# Patient Record
Sex: Female | Born: 1970 | Race: Black or African American | Hispanic: No | Marital: Single | Smoking: Never smoker
Health system: Southern US, Community
[De-identification: ages and names within clinical notes are randomized; demographics above are authoritative.]

---

## 2019-04-09 ENCOUNTER — Encounter (HOSPITAL_COMMUNITY): Payer: Self-pay | Admitting: Emergency Medicine

## 2019-04-09 ENCOUNTER — Emergency Department (HOSPITAL_COMMUNITY): Payer: Self-pay

## 2019-04-09 ENCOUNTER — Other Ambulatory Visit: Payer: Self-pay

## 2019-04-09 ENCOUNTER — Emergency Department (HOSPITAL_COMMUNITY)
Admission: EM | Admit: 2019-04-09 | Discharge: 2019-04-09 | Disposition: A | Discharge status: Home / Self Care | Payer: Self-pay | Attending: Emergency Medicine | Admitting: Emergency Medicine

## 2019-04-09 DIAGNOSIS — Z8616 Personal history of COVID-19: Secondary | ICD-10-CM

## 2019-04-09 DIAGNOSIS — R0789 Other chest pain: Secondary | ICD-10-CM | POA: Insufficient documentation

## 2019-04-09 DIAGNOSIS — R9431 Abnormal electrocardiogram [ECG] [EKG]: Secondary | ICD-10-CM

## 2019-04-09 DIAGNOSIS — Z8619 Personal history of other infectious and parasitic diseases: Secondary | ICD-10-CM

## 2019-04-09 LAB — COMPREHENSIVE METABOLIC PANEL
ALT: 14 U/L (ref 0–44)
AST: 20 U/L (ref 15–41)
Albumin: 3.3 g/dL — ABNORMAL LOW (ref 3.5–5.0)
Alkaline Phosphatase: 48 U/L (ref 38–126)
Anion gap: 7 (ref 5–15)
BUN: 13 mg/dL (ref 6–20)
CO2: 26 mmol/L (ref 22–32)
Calcium: 9.1 mg/dL (ref 8.9–10.3)
Chloride: 106 mmol/L (ref 98–111)
Creatinine, Ser: 1.06 mg/dL — ABNORMAL HIGH (ref 0.44–1.00)
GFR calc Af Amer: >60 mL/min (ref >60)
GFR calc non Af Amer: >60 mL/min (ref >60)
Glucose, Bld: 102 mg/dL — ABNORMAL HIGH (ref 70–99)
Potassium: 3.8 mmol/L (ref 3.5–5.1)
Sodium: 139 mmol/L (ref 135–145)
Total Bilirubin: 0.5 mg/dL (ref 0.3–1.2)
Total Protein: 6.7 g/dL (ref 6.5–8.1)

## 2019-04-09 LAB — CBC
HCT: 39.7 % (ref 36.0–46.0)
Hemoglobin: 12.8 g/dL (ref 12.0–15.0)
MCH: 29 pg (ref 26.0–34.0)
MCHC: 32.2 g/dL (ref 30.0–36.0)
MCV: 90 fL (ref 80.0–100.0)
Platelets: 186 10*3/uL (ref 150–400)
RBC: 4.41 MIL/uL (ref 3.87–5.11)
RDW: 13.2 % (ref 11.5–15.5)
WBC: 6.4 10*3/uL (ref 4.0–10.5)
nRBC: 0 % (ref 0.0–0.2)

## 2019-04-09 LAB — TROPONIN I (HIGH SENSITIVITY): Troponin I (High Sensitivity): 4 ng/L (ref <18)

## 2019-04-09 MED ORDER — BENZONATATE 100 MG PO CAPS
200.0000 mg | ORAL_CAPSULE | Freq: Once | ORAL | Status: AC
  Administered 2019-04-09: 200 mg via ORAL
  Filled 2019-04-09: qty 2

## 2019-04-09 MED ORDER — ALBUTEROL SULFATE HFA 108 (90 BASE) MCG/ACT IN AERS
4.0000 | INHALATION_SPRAY | Freq: Once | RESPIRATORY_TRACT | Status: AC
  Administered 2019-04-09: 4 via RESPIRATORY_TRACT
  Filled 2019-04-09: qty 6.7

## 2019-04-09 MED ORDER — AEROCHAMBER PLUS FLO-VU SMALL MISC
1.0000 | Freq: Once | Status: AC
  Administered 2019-04-09: 1
  Filled 2019-04-09: qty 1

## 2019-04-09 MED ORDER — PROMETHAZINE-DM 6.25-15 MG/5ML PO SYRP: 5.0000 mL | ORAL_SOLUTION | Freq: Four times a day (QID) | ORAL | 0 refills | Status: DC | PRN

## 2019-04-09 NOTE — ED Notes (Signed)
Patient verbalizes understanding of discharge instructions. Opportunity for questioning and answers were provided. Armband removed by staff, pt discharged from ED.  

## 2019-04-09 NOTE — ED Provider Notes (Signed)
[facility] Provider Note   CSN: [id number] Arrival date & time: 04/09/19  1737    History   Chief Complaint Chief Complaint  Patient presents with  . Shortness of Breath    HPI Janet Perry is a 48 y.o. female with no pertinent chronic past medical history who presents to the emergency department with a chief complaint of chest pressure.  The patient reports that she began having symptoms at COVID-19 on June 20 and had a positive test on June 22 after she was tested in a school gymnasium.  She reports that she works at [employer] [place] and several of her coworkers have tested positive.  She reports that she was initially having nausea, subjective fevers, and chills, but feels as if the symptoms have significantly subsided since onset.  She is here for evaluation in the ER today for pressure in the middle of her chest that is been intermittent for the last 2 days.  She reports the episodes will last for approximately 5 minutes before resolving.  She does not have any factors that bring on the pressure or relieve it.  She does not know any aggravating or alleviating factors.  She reports that she has had 2 episodes of pressure since she has been roomed in the ER.  She reports that she has been having some exertional shortness of breath, but denies shortness of breath at this time.  She reports that shortness of breath is only exacerbated by exertion and resolves with rest.  Shortness of breath does not accompany chest pressure.   She does report that she is continued to have a nonproductive cough.  She has been taking Alka-Seltzer cold and sinus with some improvement.  She denies palpitations, back pain, leg pain or swelling, orthopnea, vomiting, abdominal pain.   She reports that she is has self quarantined for 2 weeks. She is supposed to return to work in 2 days.  She is requesting a work note for another week off of work.  No  family history of VTE.  The patient does not take OCPs.  No recent surgery or immobilization.  No family history of cardiovascular disease.  The patient does not have a history of PAD, hypertension, or diabetes mellitus.  She is a non-smoker.   Janet Perry was evaluated in Emergency Department on 04/09/2019 for the symptoms described in the history of present illness. She was evaluated in the context of the global COVID-19 pandemic, which necessitated consideration that the patient might be at risk for infection with the SARS-CoV-2 virus that causes COVID-19. Institutional protocols and algorithms that pertain to the evaluation of patients at risk for COVID-19 are in a state of rapid change based on information released by regulatory bodies including the CDC and federal and state organizations. These policies and algorithms were followed during the patient's care in the ED.     The history is provided by the patient. No language interpreter was used.    History reviewed. No pertinent past medical history.  There are no active problems to display for this patient.   History reviewed. No pertinent surgical history.   OB History   No obstetric history on file.      Home Medications    Prior to Admission medications   Medication Sig Start Date End Date Taking? Authorizing Provider  promethazine-dextromethorphan (PROMETHAZINE-DM) 6.25-15 MG/5ML syrup Take 5 mLs by mouth 4 (four) times daily as needed for cough. 04/09/19  Shia Eber A, PA-C    Family History No family history on file.  Social History Social History   Tobacco Use  . Smoking status: Never Smoker  . Smokeless tobacco: Never Used  Substance Use Topics  . Alcohol use: Yes  . Drug use: Never     Allergies   Patient has no known allergies.   Review of Systems Review of Systems  Constitutional: Negative for activity change, chills and fever.  HENT: Negative for congestion and sore throat.   Eyes: Negative for  visual disturbance.  Respiratory: Positive for cough. Negative for shortness of breath and wheezing.   Cardiovascular: Positive for chest pain. Negative for palpitations and leg swelling.  Gastrointestinal: Negative for abdominal pain, diarrhea and vomiting.  Genitourinary: Negative for dysuria.  Musculoskeletal: Negative for back pain.  Skin: Negative for rash.  Allergic/Immunologic: Negative for immunocompromised state.  Neurological: Negative for weakness, numbness and headaches.  Psychiatric/Behavioral: Negative for confusion.     Physical Exam Updated Vital Signs BP 130/81   Pulse 66   Temp 98.5 F (36.9 C) (Oral)   Resp 16   SpO2 97%   Physical Exam Vitals signs and nursing note reviewed.  Constitutional:      General: She is not in acute distress.    Appearance: She is not ill-appearing, toxic-appearing or diaphoretic.     Comments: Well-appearing.  No acute distress.  HENT:     Head: Normocephalic.  Eyes:     Conjunctiva/sclera: Conjunctivae normal.     Pupils: Pupils are equal, round, and reactive to light.  Neck:     Musculoskeletal: Neck supple.  Cardiovascular:     Rate and Rhythm: Normal rate and regular rhythm.     Heart sounds: No murmur. No friction rub. No gallop.   Pulmonary:     Effort: Pulmonary effort is normal. No respiratory distress.     Breath sounds: No stridor. No wheezing, rhonchi or rales.     Comments: Lungs are clear to auscultation bilaterally.  No increased work of breathing.  Patient has had multiple nonproductive coughing episodes during evaluation. Chest:     Chest wall: No tenderness.  Abdominal:     General: There is no distension.     Palpations: Abdomen is soft. There is no mass.     Tenderness: There is no abdominal tenderness. There is no right CVA tenderness, left CVA tenderness, guarding or rebound.     Hernia: No hernia is present.  Musculoskeletal:        General: No tenderness.     Right lower leg: No edema.     Left  lower leg: No edema.  Skin:    General: Skin is warm.     Findings: No rash.  Neurological:     Mental Status: She is alert.  Psychiatric:        Behavior: Behavior normal.      ED Treatments / Results  Labs (all labs ordered are listed, but only abnormal results are displayed) Labs Reviewed  TROPONIN I (HIGH SENSITIVITY)  TROPONIN I (HIGH SENSITIVITY)  CBC  COMPREHENSIVE METABOLIC PANEL    EKG None  Radiology Dg Chest Portable 1 View  Result Date: 04/09/2019 CLINICAL DATA:  Positive COVID-19 test.  Weakness and cough. EXAM: PORTABLE CHEST 1 VIEW COMPARISON:  None. FINDINGS: The study is limited due to the portable technique. The cardiomediastinal silhouette is within normal limits given technique. Mild atelectasis in the left base. No other acute abnormalities. IMPRESSION: No active disease.  Electronically Signed   By: Gerome Samavid  Williams III M.D   On: 04/09/2019 18:23    Procedures Procedures (including critical care time)  Medications Ordered in ED Medications  albuterol (VENTOLIN HFA) 108 (90 Base) MCG/ACT inhaler 4 puff (has no administration in time range)  AeroChamber Plus Flo-Vu Small device MISC 1 each (has no administration in time range)  benzonatate (TESSALON) capsule 200 mg (has no administration in time range)     Initial Impression / Assessment and Plan / ED Course  I have reviewed the triage vital signs and the nursing notes.  Pertinent labs & imaging results that were available during my care of the patient were reviewed by me and considered in my medical decision making (see chart for details).        48 year old female with 14 days of COVID symptoms.  She tested +12 days ago on June 22 [facility].  She has no chronic medical conditions.  She is here today with intermittent chest pressure that began yesterday.  She has been having exertional shortness of breath that resolves with rest.  Chest pressure and shortness of breath  are not correlated.  She is PERC negative.  She is a low risk heart score given her medical history.  Although she has COVID-19, I have a low suspicion for PE.  Vital signs are normal.  Chest x-ray is unremarkable.  Will order basic labs, EKG, and one troponin.  If labs are reassuring, will plan for discharge to home with supportive care.  Patient is requesting a work note for an additional week off of work.  Discussed that since she has been afebrile and has already quarantined for 14 days, then she can return to work based on [employer] guidelines.  [facility] care transferred to [facility] at the end of my shift. Patient presentation, ED course, and plan of care discussed with review of all pertinent labs and imaging. Please see his/her note for further details regarding further ED course and disposition.   Final Clinical Impressions(s) / ED Diagnoses   Final diagnoses:  History of 2019 novel coronavirus disease (COVID-19)  Sensation of chest pressure    ED Discharge Orders         Ordered    promethazine-dextromethorphan (PROMETHAZINE-DM) 6.25-15 MG/5ML syrup  4 times daily PRN     04/09/19 1906           Parv Manthey, Coral ElseMia A, PA-C 04/09/19 1922    Gallianouratolo, Adam, DO 04/09/19 2335

## 2019-04-09 NOTE — ED Triage Notes (Signed)
Pt c/o shortness of breath, chest pain and chills x 2 weeks. Pt tested positive for COVID on 6/25. Denies worsening symptoms, but no improvement of symptoms.

## 2019-04-09 NOTE — ED Provider Notes (Signed)
I assumed care of patient from previous team at shift change, please see their note for full H&P.  Briefly patient is here for evaluation of intermittent chest pressure.  She was recently diagnosed with coronavirus however that was 2 weeks ago.  She reports overall she is doing better.   Plan is to follow-up on chest x-ray, EKG, labs, and obtain 1 troponin.  Labs Reviewed  COMPREHENSIVE METABOLIC PANEL - Abnormal; Notable for the following components:      Result Value   Glucose, Bld 102 (*)    Creatinine, Ser 1.06 (*)    Albumin 3.3 (*)    All other components within normal limits  TROPONIN I (HIGH SENSITIVITY)  CBC  TROPONIN I (HIGH SENSITIVITY)    Dg Chest Portable 1 View  Result Date: 04/09/2019 CLINICAL DATA:  Positive COVID-19 test.  Weakness and cough. EXAM: PORTABLE CHEST 1 VIEW COMPARISON:  None. FINDINGS: The study is limited due to the portable technique. The cardiomediastinal silhouette is within normal limits given technique. Mild atelectasis in the left base. No other acute abnormalities. IMPRESSION: No active disease. Electronically Signed   By: Dorise Bullion III M.D   On: 04/09/2019 18:23      EKG Interpretation  Date/Time:  Saturday April 09 2019 19:06:06 EDT Ventricular Rate:  64 PR Interval:    QRS Duration: 84 QT Interval:  511 QTC Calculation: 528 R Axis:   52 Text Interpretation:  Sinus rhythm Borderline T abnormalities, diffuse leads Prolonged QT interval No previous ECGs available Confirmed by Gareth Morgan [phone]) on 04/09/2019 9:50:30 PM        Plan established by previous team.  Labs were reviewed without evidence of acute abnormality.  Chest x-ray does not show significant consolidation or other acute abnormality.  EKG without evidence of ischemia, does show mildly prolonged QT interval.  Do not suspect that QT interval is contributing to patient's symptoms today.  She was given written information about prolonged QT syndrome including some of the  medications to avoid.  Return precautions were discussed with patient who states their understanding.  At the time of discharge patient denied any unaddressed complaints or concerns.  Patient is agreeable for discharge home.     Lorin Glass, Hershal Coria 04/09/19 2203    Gareth Morgan, MD 04/15/19 [phone]

## 2019-04-09 NOTE — Discharge Instructions (Addendum)
Thank you for allowing me to care for you today in the Emergency Department.   Use the thermometer at home to monitor your fever.  If your temperature is less than 100.4 you can return to work.  If your fever returns, you can take 650 mg of Tylenol once every 6 hours.  Use 2 puffs of the albuterol inhaler with a spacer every 4 hours as needed for shortness of breath or cough.  You can swallow 5 mL's of Promethazine DM every 6 hours as needed for cough.  Return to the emergency department if your lips or fingers turn blue, if you develop persistent vomiting, persistent shortness of breath, if you pass out, or develop other new, concerning symptoms.  Your EKG showed that you have something called prolonged QT interval.  Please avoid medication such as Benadryl, nausea medicines including Zofran.  If you take these it can worsen this condition.  If it becomes much much longer you can have abnormal heart rhythms.  Please follow-up with your primary care doctor.

## 2020-04-30 IMAGING — DX PORTABLE CHEST - 1 VIEW
1 series · 1 of 1 positions shown · non-contrast
Comparison: None.

CLINICAL DATA: Positive UCYUW-BH test.  Weakness and cough.

EXAM:
PORTABLE CHEST 1 VIEW

[chest]
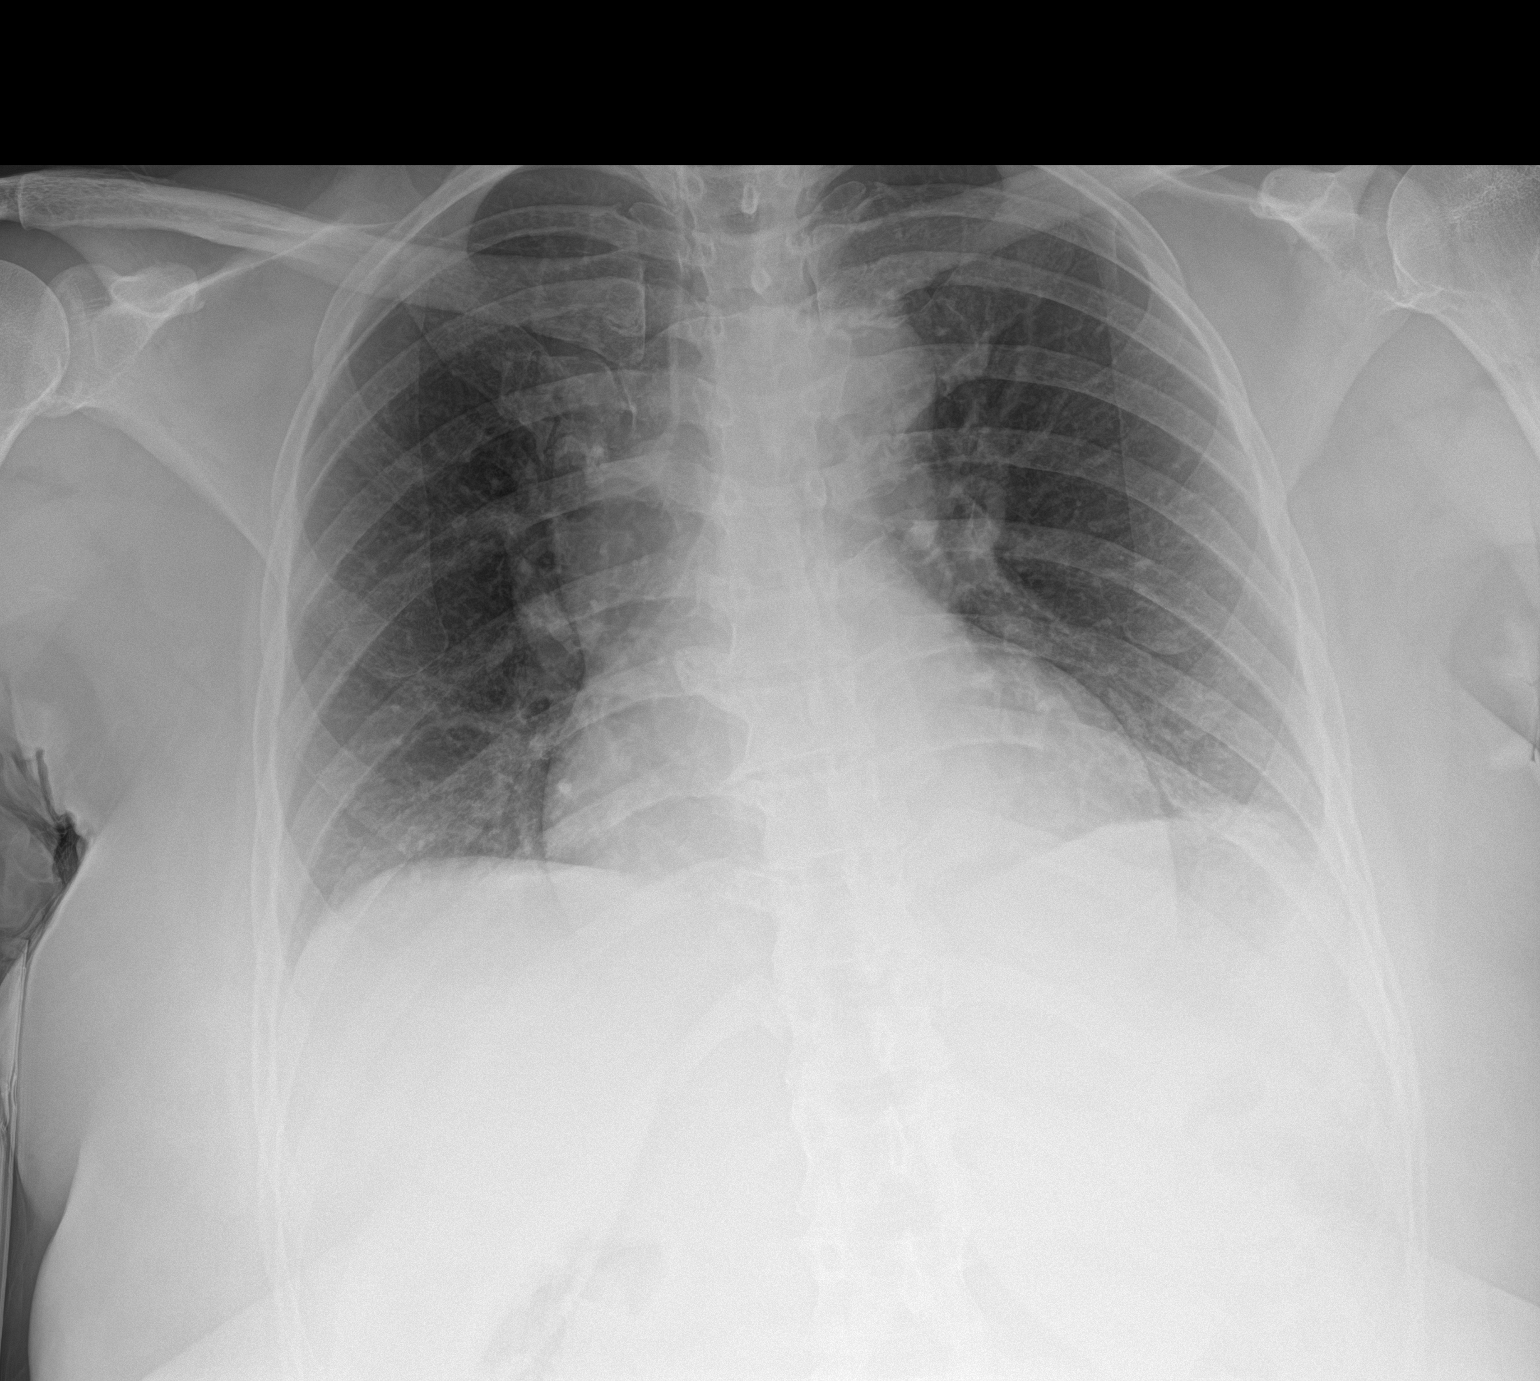

[1 of 1 positions shown; findings below may reference images not displayed]

FINDINGS: The study is limited due to the portable technique. The
cardiomediastinal silhouette is within normal limits given
technique. Mild atelectasis in the left base. No other acute
abnormalities.
IMPRESSION: No active disease.

## 2022-07-21 ENCOUNTER — Other Ambulatory Visit: Payer: Self-pay

## 2022-07-21 ENCOUNTER — Encounter (HOSPITAL_BASED_OUTPATIENT_CLINIC_OR_DEPARTMENT_OTHER): Payer: Self-pay

## 2022-07-21 DIAGNOSIS — K660 Peritoneal adhesions (postprocedural) (postinfection): Secondary | ICD-10-CM | POA: Diagnosis present

## 2022-07-21 DIAGNOSIS — K3532 Acute appendicitis with perforation and localized peritonitis, without abscess: Secondary | ICD-10-CM | POA: Diagnosis not present

## 2022-07-21 DIAGNOSIS — N179 Acute kidney failure, unspecified: Secondary | ICD-10-CM | POA: Diagnosis present

## 2022-07-21 DIAGNOSIS — R109 Unspecified abdominal pain: Secondary | ICD-10-CM | POA: Diagnosis not present

## 2022-07-21 DIAGNOSIS — K567 Ileus, unspecified: Secondary | ICD-10-CM | POA: Diagnosis not present

## 2022-07-21 NOTE — ED Triage Notes (Signed)
Pt presents to the ED with [facility]. Reports abd pain and vomiting that started last night. Abdominal tenderness with palpation.

## 2022-07-22 ENCOUNTER — Emergency Department (HOSPITAL_BASED_OUTPATIENT_CLINIC_OR_DEPARTMENT_OTHER): Payer: Commercial Managed Care - HMO

## 2022-07-22 ENCOUNTER — Encounter (HOSPITAL_BASED_OUTPATIENT_CLINIC_OR_DEPARTMENT_OTHER): Payer: Self-pay | Admitting: Anesthesiology

## 2022-07-22 ENCOUNTER — Other Ambulatory Visit: Payer: Self-pay

## 2022-07-22 ENCOUNTER — Emergency Department (HOSPITAL_BASED_OUTPATIENT_CLINIC_OR_DEPARTMENT_OTHER): Payer: Commercial Managed Care - HMO | Admitting: Anesthesiology

## 2022-07-22 ENCOUNTER — Emergency Department (HOSPITAL_COMMUNITY): Payer: Commercial Managed Care - HMO | Admitting: Anesthesiology

## 2022-07-22 ENCOUNTER — Inpatient Hospital Stay (HOSPITAL_BASED_OUTPATIENT_CLINIC_OR_DEPARTMENT_OTHER)
Admission: EM | Admit: 2022-07-22 | Discharge: 2022-07-25 | DRG: 336 | Disposition: A | Discharge status: Home / Self Care | Payer: Commercial Managed Care - HMO | Attending: General Surgery | Admitting: General Surgery

## 2022-07-22 ENCOUNTER — Encounter (HOSPITAL_COMMUNITY): Admission: EM | Disposition: A | Discharge status: Home / Self Care | Payer: Self-pay | Source: Home / Self Care

## 2022-07-22 DIAGNOSIS — I8510 Secondary esophageal varices without bleeding: Secondary | ICD-10-CM | POA: Diagnosis not present

## 2022-07-22 DIAGNOSIS — I10 Essential (primary) hypertension: Secondary | ICD-10-CM | POA: Diagnosis not present

## 2022-07-22 DIAGNOSIS — K31811 Angiodysplasia of stomach and duodenum with bleeding: Secondary | ICD-10-CM | POA: Diagnosis not present

## 2022-07-22 DIAGNOSIS — K7460 Unspecified cirrhosis of liver: Secondary | ICD-10-CM

## 2022-07-22 DIAGNOSIS — Z87891 Personal history of nicotine dependence: Secondary | ICD-10-CM

## 2022-07-22 DIAGNOSIS — K3532 Acute appendicitis with perforation and localized peritonitis, without abscess: Principal | ICD-10-CM | POA: Diagnosis present

## 2022-07-22 HISTORY — PX: LAPAROSCOPIC APPENDECTOMY: SHX408

## 2022-07-22 LAB — COMPREHENSIVE METABOLIC PANEL
ALT: 6 U/L (ref 0–44)
AST: 12 U/L — ABNORMAL LOW (ref 15–41)
Albumin: 4.2 g/dL (ref 3.5–5.0)
Alkaline Phosphatase: 43 U/L (ref 38–126)
Anion gap: 11 (ref 5–15)
BUN: 12 mg/dL (ref 6–20)
CO2: 25 mmol/L (ref 22–32)
Calcium: 9.6 mg/dL (ref 8.9–10.3)
Chloride: 100 mmol/L (ref 98–111)
Creatinine, Ser: 1.1 mg/dL — ABNORMAL HIGH (ref 0.44–1.00)
GFR, Estimated: >60 mL/min (ref >60)
Glucose, Bld: 142 mg/dL — ABNORMAL HIGH (ref 70–99)
Potassium: 4.1 mmol/L (ref 3.5–5.1)
Sodium: 136 mmol/L (ref 135–145)
Total Bilirubin: 1 mg/dL (ref 0.3–1.2)
Total Protein: 7.7 g/dL (ref 6.5–8.1)

## 2022-07-22 LAB — CBC
HCT: 43.2 % (ref 36.0–46.0)
Hemoglobin: 14.5 g/dL (ref 12.0–15.0)
MCH: 29.2 pg (ref 26.0–34.0)
MCHC: 33.6 g/dL (ref 30.0–36.0)
MCV: 87.1 fL (ref 80.0–100.0)
Platelets: 181 10*3/uL (ref 150–400)
RBC: 4.96 MIL/uL (ref 3.87–5.11)
RDW: 12.6 % (ref 11.5–15.5)
WBC: 11.5 10*3/uL — ABNORMAL HIGH (ref 4.0–10.5)
nRBC: 0 % (ref 0.0–0.2)

## 2022-07-22 LAB — LIPASE, BLOOD: Lipase: <10 U/L — ABNORMAL LOW (ref 11–51)

## 2022-07-22 SURGERY — APPENDECTOMY, LAPAROSCOPIC
Anesthesia: General

## 2022-07-22 MED ORDER — DEXAMETHASONE SODIUM PHOSPHATE 10 MG/ML IJ SOLN
INTRAMUSCULAR | Status: DC | PRN
  Administered 2022-07-22: 10 mg via INTRAVENOUS

## 2022-07-22 MED ORDER — CHLORHEXIDINE GLUCONATE CLOTH 2 % EX PADS: 6.0000 | MEDICATED_PAD | Freq: Once | CUTANEOUS | Status: DC

## 2022-07-22 MED ORDER — LIDOCAINE 2% (20 MG/ML) 5 ML SYRINGE
INTRAMUSCULAR | Status: DC | PRN
  Administered 2022-07-22: 60 mg via INTRAVENOUS

## 2022-07-22 MED ORDER — ONDANSETRON HCL 4 MG/2ML IJ SOLN
4.0000 mg | Freq: Once | INTRAMUSCULAR | Status: AC
  Administered 2022-07-22: 4 mg via INTRAVENOUS
  Filled 2022-07-22: qty 2

## 2022-07-22 MED ORDER — LACTATED RINGERS IV SOLN: INTRAVENOUS | Status: DC

## 2022-07-22 MED ORDER — DIPHENHYDRAMINE HCL 25 MG PO CAPS: 25.0000 mg | ORAL_CAPSULE | Freq: Four times a day (QID) | ORAL | Status: DC | PRN

## 2022-07-22 MED ORDER — PROCHLORPERAZINE EDISYLATE 10 MG/2ML IJ SOLN: 5.0000 mg | Freq: Four times a day (QID) | INTRAMUSCULAR | Status: DC | PRN

## 2022-07-22 MED ORDER — MIDAZOLAM HCL 5 MG/5ML IJ SOLN
INTRAMUSCULAR | Status: DC | PRN
  Administered 2022-07-22: 2 mg via INTRAVENOUS

## 2022-07-22 MED ORDER — BUPIVACAINE-EPINEPHRINE 0.25% -1:200000 IJ SOLN
INTRAMUSCULAR | Status: DC | PRN
  Administered 2022-07-22: 23 mL

## 2022-07-22 MED ORDER — SODIUM CHLORIDE 0.9 % IV SOLN: INTRAVENOUS | Status: DC

## 2022-07-22 MED ORDER — MORPHINE SULFATE (PF) 4 MG/ML IV SOLN
4.0000 mg | Freq: Once | INTRAVENOUS | Status: AC
  Administered 2022-07-22: 4 mg via INTRAVENOUS
  Filled 2022-07-22: qty 1

## 2022-07-22 MED ORDER — ONDANSETRON 4 MG PO TBDP
4.0000 mg | ORAL_TABLET | Freq: Four times a day (QID) | ORAL | Status: DC | PRN
  Administered 2022-07-23 – 2022-07-24 (×2): 4 mg via ORAL
  Filled 2022-07-22 (×2): qty 1

## 2022-07-22 MED ORDER — DOCUSATE SODIUM 100 MG PO CAPS
100.0000 mg | ORAL_CAPSULE | Freq: Two times a day (BID) | ORAL | Status: DC
  Administered 2022-07-22 – 2022-07-25 (×6): 100 mg via ORAL
  Filled 2022-07-22 (×6): qty 1

## 2022-07-22 MED ORDER — FENTANYL CITRATE (PF) 250 MCG/5ML IJ SOLN
INTRAMUSCULAR | Status: DC | PRN
  Administered 2022-07-22: 100 ug via INTRAVENOUS
  Administered 2022-07-22: 50 ug via INTRAVENOUS

## 2022-07-22 MED ORDER — DIPHENHYDRAMINE HCL 50 MG/ML IJ SOLN: 25.0000 mg | Freq: Four times a day (QID) | INTRAMUSCULAR | Status: DC | PRN

## 2022-07-22 MED ORDER — LACTATED RINGERS IR SOLN
Status: DC | PRN
  Administered 2022-07-22: 2000 mL

## 2022-07-22 MED ORDER — SIMETHICONE 80 MG PO CHEW
40.0000 mg | CHEWABLE_TABLET | Freq: Four times a day (QID) | ORAL | Status: DC | PRN
  Filled 2022-07-22: qty 1

## 2022-07-22 MED ORDER — HYDROMORPHONE HCL 1 MG/ML IJ SOLN: 0.2500 mg | INTRAMUSCULAR | Status: DC | PRN

## 2022-07-22 MED ORDER — PANTOPRAZOLE SODIUM 40 MG PO TBEC
40.0000 mg | DELAYED_RELEASE_TABLET | Freq: Every day | ORAL | Status: DC
  Administered 2022-07-22 – 2022-07-25 (×4): 40 mg via ORAL
  Filled 2022-07-22 (×4): qty 1

## 2022-07-22 MED ORDER — SUGAMMADEX SODIUM 200 MG/2ML IV SOLN
INTRAVENOUS | Status: DC | PRN
  Administered 2022-07-22: 200 mg via INTRAVENOUS

## 2022-07-22 MED ORDER — PIPERACILLIN-TAZOBACTAM 3.375 G IVPB
3.3750 g | Freq: Once | INTRAVENOUS | Status: AC
  Administered 2022-07-22: 3.375 g via INTRAVENOUS
  Filled 2022-07-22: qty 50

## 2022-07-22 MED ORDER — ONDANSETRON HCL 4 MG/2ML IJ SOLN
INTRAMUSCULAR | Status: DC | PRN
  Administered 2022-07-22: 4 mg via INTRAVENOUS

## 2022-07-22 MED ORDER — ONDANSETRON HCL 4 MG/2ML IJ SOLN: 4.0000 mg | Freq: Once | INTRAMUSCULAR | Status: DC | PRN

## 2022-07-22 MED ORDER — PROCHLORPERAZINE MALEATE 10 MG PO TABS: 10.0000 mg | ORAL_TABLET | Freq: Four times a day (QID) | ORAL | Status: DC | PRN

## 2022-07-22 MED ORDER — METHOCARBAMOL 1000 MG/10ML IJ SOLN: 500.0000 mg | Freq: Three times a day (TID) | INTRAVENOUS | Status: DC | PRN

## 2022-07-22 MED ORDER — OXYCODONE HCL 5 MG/5ML PO SOLN: 5.0000 mg | Freq: Once | ORAL | Status: DC | PRN

## 2022-07-22 MED ORDER — PIPERACILLIN-TAZOBACTAM 3.375 G IVPB
3.3750 g | Freq: Three times a day (TID) | INTRAVENOUS | Status: DC
  Administered 2022-07-22 – 2022-07-25 (×9): 3.375 g via INTRAVENOUS
  Filled 2022-07-22 (×9): qty 50

## 2022-07-22 MED ORDER — AMISULPRIDE (ANTIEMETIC) 5 MG/2ML IV SOLN: 10.0000 mg | Freq: Once | INTRAVENOUS | Status: DC | PRN

## 2022-07-22 MED ORDER — ENOXAPARIN SODIUM 40 MG/0.4ML IJ SOSY
40.0000 mg | PREFILLED_SYRINGE | INTRAMUSCULAR | Status: DC
  Administered 2022-07-23 – 2022-07-25 (×3): 40 mg via SUBCUTANEOUS
  Filled 2022-07-22 (×3): qty 0.4

## 2022-07-22 MED ORDER — KETOROLAC TROMETHAMINE 30 MG/ML IJ SOLN
30.0000 mg | Freq: Once | INTRAMUSCULAR | Status: AC
  Administered 2022-07-22: 30 mg via INTRAVENOUS
  Filled 2022-07-22: qty 1

## 2022-07-22 MED ORDER — OXYCODONE HCL 5 MG PO TABS
5.0000 mg | ORAL_TABLET | ORAL | Status: DC | PRN
  Administered 2022-07-22 – 2022-07-24 (×3): 5 mg via ORAL
  Filled 2022-07-22 (×3): qty 1

## 2022-07-22 MED ORDER — BUPIVACAINE-EPINEPHRINE (PF) 0.25% -1:200000 IJ SOLN
INTRAMUSCULAR | Status: AC
  Filled 2022-07-22: qty 30

## 2022-07-22 MED ORDER — ONDANSETRON HCL 4 MG/2ML IJ SOLN
4.0000 mg | Freq: Four times a day (QID) | INTRAMUSCULAR | Status: DC | PRN
  Filled 2022-07-22: qty 2

## 2022-07-22 MED ORDER — METHOCARBAMOL 500 MG PO TABS
500.0000 mg | ORAL_TABLET | Freq: Three times a day (TID) | ORAL | Status: DC | PRN
  Administered 2022-07-22 – 2022-07-23 (×3): 500 mg via ORAL
  Filled 2022-07-22 (×3): qty 1

## 2022-07-22 MED ORDER — OXYCODONE HCL 5 MG PO TABS: 5.0000 mg | ORAL_TABLET | Freq: Once | ORAL | Status: DC | PRN

## 2022-07-22 MED ORDER — ACETAMINOPHEN 10 MG/ML IV SOLN
INTRAVENOUS | Status: AC
  Filled 2022-07-22: qty 100

## 2022-07-22 MED ORDER — ACETAMINOPHEN 10 MG/ML IV SOLN
INTRAVENOUS | Status: DC | PRN
  Administered 2022-07-22: 1000 mg via INTRAVENOUS

## 2022-07-22 MED ORDER — 0.9 % SODIUM CHLORIDE (POUR BTL) OPTIME
TOPICAL | Status: DC | PRN
  Administered 2022-07-22: 1000 mL

## 2022-07-22 MED ORDER — KETOROLAC TROMETHAMINE 30 MG/ML IJ SOLN
INTRAMUSCULAR | Status: DC | PRN
  Administered 2022-07-22: 30 mg via INTRAVENOUS

## 2022-07-22 MED ORDER — DEXMEDETOMIDINE HCL IN NACL 80 MCG/20ML IV SOLN
INTRAVENOUS | Status: DC | PRN
  Administered 2022-07-22: 8 ug via BUCCAL

## 2022-07-22 MED ORDER — SCOPOLAMINE 1 MG/3DAYS TD PT72
1.0000 | MEDICATED_PATCH | Freq: Once | TRANSDERMAL | Status: DC
  Administered 2022-07-22: 1.5 mg via TRANSDERMAL
  Filled 2022-07-22: qty 1

## 2022-07-22 MED ORDER — MORPHINE SULFATE (PF) 2 MG/ML IV SOLN: 2.0000 mg | INTRAVENOUS | Status: DC | PRN

## 2022-07-22 MED ORDER — POLYETHYLENE GLYCOL 3350 17 G PO PACK: 17.0000 g | PACK | Freq: Every day | ORAL | Status: DC | PRN

## 2022-07-22 MED ORDER — FENTANYL CITRATE PF 50 MCG/ML IJ SOSY
50.0000 ug | PREFILLED_SYRINGE | INTRAMUSCULAR | Status: DC | PRN
  Administered 2022-07-22: 50 ug via INTRAVENOUS
  Filled 2022-07-22: qty 1

## 2022-07-22 MED ORDER — ROCURONIUM BROMIDE 10 MG/ML (PF) SYRINGE
PREFILLED_SYRINGE | INTRAVENOUS | Status: DC | PRN
  Administered 2022-07-22: 50 mg via INTRAVENOUS

## 2022-07-22 MED ORDER — PROPOFOL 10 MG/ML IV BOLUS
INTRAVENOUS | Status: DC | PRN
  Administered 2022-07-22: 160 mg via INTRAVENOUS

## 2022-07-22 MED ORDER — METOPROLOL TARTRATE 5 MG/5ML IV SOLN: 5.0000 mg | Freq: Four times a day (QID) | INTRAVENOUS | Status: DC | PRN

## 2022-07-22 MED ORDER — ACETAMINOPHEN 500 MG PO TABS
1000.0000 mg | ORAL_TABLET | Freq: Four times a day (QID) | ORAL | Status: DC
  Administered 2022-07-22 – 2022-07-25 (×10): 1000 mg via ORAL
  Filled 2022-07-22 (×11): qty 2

## 2022-07-22 MED ORDER — SUCCINYLCHOLINE CHLORIDE 200 MG/10ML IV SOSY
PREFILLED_SYRINGE | INTRAVENOUS | Status: DC | PRN
  Administered 2022-07-22: 120 mg via INTRAVENOUS

## 2022-07-22 MED ORDER — IOHEXOL 300 MG/ML  SOLN
100.0000 mL | Freq: Once | INTRAMUSCULAR | Status: AC | PRN
  Administered 2022-07-22: 75 mL via INTRAVENOUS

## 2022-07-22 MED ORDER — SODIUM CHLORIDE 0.9 % IV BOLUS
1000.0000 mL | Freq: Once | INTRAVENOUS | Status: AC
  Administered 2022-07-22: 1000 mL via INTRAVENOUS

## 2022-07-22 SURGICAL SUPPLY — 42 items
APPLIER CLIP 5 13 M/L LIGAMAX5 (MISCELLANEOUS)
BAG COUNTER SPONGE SURGICOUNT (BAG) IMPLANT
CABLE HIGH FREQUENCY MONO STRZ (ELECTRODE) IMPLANT
CLIP APPLIE 5 13 M/L LIGAMAX5 (MISCELLANEOUS) IMPLANT
COVER SURGICAL LIGHT HANDLE (MISCELLANEOUS) ×1 IMPLANT
CUTTER FLEX LINEAR 45M (STAPLE) IMPLANT
DEVICE SUTURE ENDOST 10MM (ENDOMECHANICALS) IMPLANT
DRSG TEGADERM 2-3/8X2-3/4 SM (GAUZE/BANDAGES/DRESSINGS) IMPLANT
DRSG TEGADERM 4X4.75 (GAUZE/BANDAGES/DRESSINGS) IMPLANT
GAUZE SPONGE 2X2 8PLY STRL LF (GAUZE/BANDAGES/DRESSINGS) IMPLANT
GLOVE BIO SURGEON STRL SZ7.5 (GLOVE) ×1 IMPLANT
GLOVE INDICATOR 8.0 STRL GRN (GLOVE) ×1 IMPLANT
GOWN STRL REUS W/ TWL XL LVL3 (GOWN DISPOSABLE) ×2 IMPLANT
GOWN STRL REUS W/TWL XL LVL3 (GOWN DISPOSABLE) ×2
GRASPER SUT TROCAR 14GX15 (MISCELLANEOUS) IMPLANT
IRRIG SUCT STRYKERFLOW 2 WTIP (MISCELLANEOUS) ×1
IRRIGATION SUCT STRKRFLW 2 WTP (MISCELLANEOUS) ×1 IMPLANT
IV LACTATED RINGERS 1000ML (IV SOLUTION) ×1 IMPLANT
KIT BASIN OR (CUSTOM PROCEDURE TRAY) ×1 IMPLANT
POUCH RETRIEVAL ECOSAC 10 (ENDOMECHANICALS) ×1 IMPLANT
POUCH RETRIEVAL ECOSAC 10MM (ENDOMECHANICALS) ×1
RELOAD 45 VASCULAR/THIN (ENDOMECHANICALS) ×1 IMPLANT
RELOAD ENDO STITCH 2.0 (ENDOMECHANICALS) ×2
RELOAD STAPLE 45 2.5 WHT GRN (ENDOMECHANICALS) IMPLANT
RELOAD STAPLE 45 3.5 BLU ETS (ENDOMECHANICALS) IMPLANT
RELOAD STAPLE TA45 3.5 REG BLU (ENDOMECHANICALS) IMPLANT
RELOAD SUT SNGL STCH BLK 2-0 (ENDOMECHANICALS) IMPLANT
SCISSORS LAP 5X35 DISP (ENDOMECHANICALS) IMPLANT
SET TUBE SMOKE EVAC HIGH FLOW (TUBING) ×1 IMPLANT
SHEARS HARMONIC ACE PLUS 36CM (ENDOMECHANICALS) ×1 IMPLANT
SLEEVE Z-THREAD 5X100MM (TROCAR) ×1 IMPLANT
SPIKE FLUID TRANSFER (MISCELLANEOUS) ×1 IMPLANT
STRIP CLOSURE SKIN 1/2X4 (GAUZE/BANDAGES/DRESSINGS) IMPLANT
SUT MNCRL AB 4-0 PS2 18 (SUTURE) ×1 IMPLANT
SUT RELOAD ENDO STITCH 2.0 (ENDOMECHANICALS) ×2
SUT VICRYL 0 TIES 12 18 (SUTURE) IMPLANT
SUT VICRYL 0 UR6 27IN ABS (SUTURE) IMPLANT
SUTURE RELOAD ENDO STITCH 2.0 (ENDOMECHANICALS) ×2 IMPLANT
TOWEL OR 17X26 10 PK STRL BLUE (TOWEL DISPOSABLE) ×1 IMPLANT
TRAY LAPAROSCOPIC (CUSTOM PROCEDURE TRAY) ×1 IMPLANT
TROCAR BALLN 12MMX100 BLUNT (TROCAR) ×1 IMPLANT
TROCAR Z-THREAD OPTICAL 5X100M (TROCAR) ×1 IMPLANT

## 2022-07-22 NOTE — Op Note (Signed)
Tamanna Whitson Bernales [id number] January 11, 1971 07/22/2022  Appendectomy, Lap, Procedure Note  Indications: The patient presented with a history of right-sided abdominal pain. A CT revealed findings consistent with acute appendicitis.  There was some evidence of fluid in the pelvis but no gross free air.  Pre-operative Diagnosis: acute appendicitis  Post-operative Diagnosis: Acute perforated appendicitis  Surgeon: Greer Pickerel MD FACS  Assistants: none  Anesthesia: General endotracheal anesthesia  Procedure Details  The patient was seen again in the Holding Room. The risks, benefits, complications, treatment options, and expected outcomes were discussed with the patient and/or family. The possibilities of perforation of viscus, bleeding, recurrent infection, the need for additional procedures, failure to diagnose a condition, and creating a complication requiring transfusion or operation were discussed. There was concurrence with the proposed plan and informed consent was obtained. The site of surgery was properly noted. The patient was taken to Operating Room, identified as Jodelle Gross Greenfeld and the procedure verified as Appendectomy. A Time Out was held and the above information confirmed.  The patient was placed in the supine position and general anesthesia was induced, along with placement of orogastric tube, SCDs. The patient voided prior to surgery. The abdomen was prepped and draped in a sterile fashion. A 1.5 centimeter infraumbilical incision was made through an old small vertical incision that looked like a former trocar scar..  The umbilical stalk was elevated, and the midline fascia was incised with a #11 blade.  A Kelly clamp was used to confirm entrance into the peritoneal cavity.  A pursestring suture was passed around the incision with a 0 Vicryl.  A 33mm Hasson was introduced into the abdomen and the tails of the suture were used to hold the Hasson in place.   The pneumoperitoneum was  then established to steady pressure of 15 mmHg.  Laparoscope was advanced into the abdominal cavity.  There was evidence of omentum adhesions to the lower midline.  I was able to navigate the laparoscope around this omental adhesions to the lower midline and placed a 5 mm trocar in the left lower quadrant.  The laparoscope was then placed in the left lower quadrant and there was evidence of significant omental adhesions running from the umbilicus all the way down a little bit toward the pelvis.  I ended up placing a 5 mm trocar in the left upper quadrant.  Then using a combination of EndoShears and harmonic scalpel I took down the omental adhesions from the anterior abdominal wall.  He became obvious that part of the omental adhesions were tethered to a loop of sigmoid colon that was being tethered up to the abdominal wall.  There is no evidence of colotomy in taking down these adhesions.  I did take down some of the peritoneum off the abdominal wall in order to stay away from the colon is much as possible.  Upon further inspection once I released the adhesions from the anterior abdominal wall, I reinspected this area of colon.  Again there is no evidence of colotomy.  It was a little bit unclear if there was perhaps a superficial thermal injury versus a small layer of peritoneum that was densely adhered to a section of the sigmoid colon had been taken down off the anterior abdominal wall.  I decided to proceed with the remainder of the procedure and reinspected toward the end of the procedure.  There was significant amount of purulent fluid in the pelvis and in the right upper quadrant above the liver.  There  is evidence of purulent peritonitis.  The peritoneum was hyperemic and inflamed.. The patient was placed in Trendelenburg and left lateral decubitus position. The small intestines were retracted in the cephalad and left lateral direction away from the pelvis and right lower quadrant. The patient was found to  have an inflamed perforated appendix that was extending into the pelvis. There was evidence of perforation.  The appendix was carefully dissected. The appendix was was skeletonized with the harmonic scalpel.   The appendix was divided at its base using an endo-GIA stapler with a white load.  The first fire of the stapler misfired.  A new white cartridge was obtained and I clamped across the base of the appendix where it entered the cecum at the level of the ileocecal valve.  I fired across this area.  The staple line was completely intact.  All staples were well formed.  And no appendiceal stump was left in place. The appendix was removed from the abdomen with an Ecco bag through the umbilical port.  There was no evidence of bleeding, leakage, or complication after division of the appendix. 2L Irrigation was also performed and irrigate suctioned from the abdomen as well.  I reinspected the sigmoid colon.  Again there was no evidence of serosal tear or colotomy.  There is that one area that where it looked like a small cuff of peritoneum was attached to the sigmoid colon wall.  It did not appear consistent with a thermal injury.  It appeared more consistent with a small layer of peritoneum that was adhered to the sigmoid colon wall.  But because I had used harmonic scalpel in that area I decided to Lembert that area and patch it with an Endo Stitch with a silk suture x2.  The area was well covered.  The umbilical port site was closed with the purse string suture.  An additional interrupted 0 Vicryl was placed at the umbilical fascia with a PMI suture passer with laparoscopic guidance.  The closure was viewed laparoscopically. There was no residual palpable fascial defect.  The trocar site skin wounds were closed with 4-0 Monocryl. Steri-strips, gauze & tegaderms were applied to the skin incisions.  Instrument, sponge, and needle counts were correct at the conclusion of the case.   Findings: The appendix  was found to be inflamed. There were not signs of necrosis.  There was perforation. There was not abscess formation.   CASE DATA:  Type of patient?: LDOW CASE (Surgical Hospitalist [facility] Inpatient)  Status of Case? EMERGENT Add On  Infection Present At Time Of Surgery (PATOS)?  PUS at the pelvis, right upper quadrant; purulent peritonitis*      Estimated Blood Loss:  Minimal         Drains: none         Specimens: appendix         Complications:  None; patient tolerated the procedure well.         Disposition: PACU - hemodynamically stable.         Condition: stable  Mary Sella. Andrey Campanile, MD, FACS General, Bariatric, & Minimally Invasive Surgery [facility], [place]

## 2022-07-22 NOTE — H&P (Addendum)
CC: abdominal pain, n/v  Requesting provider: dr [place] Jock  HPI: Janet Perry is an 51 y.o. female who is here for worsening abdominal pain.  She states that she developed abdominal pain and vomiting on Sunday evening.  It was constant.  Initially was like sharp shooting stabbing pains all over her abdomen but then it became focused more in her right lower quadrant.  She denies any fevers or chills.  She states that she was just mainly vomiting up sort of clear fluid.  Last bowel movement was Monday.  Because her pain was persistent she went to [place] for evaluation.  There she underwent labs and a CT scan which revealed acute appendicitis with possible minor perforation.  General surgery was consulted and we arranged for the patient to be transported to [facility].  SHe denies any prior abdominal surgery.  She denies any personal or family history of blood clots.  Denies any melena hematochezia.  She denies any hematemesis.  No dysuria.  Denies any past medical history.  History reviewed. No pertinent past medical history.  History reviewed. No pertinent surgical history.  History reviewed. No pertinent family history.  Social:  reports that she has never smoked. She has never used smokeless tobacco. She reports current alcohol use. She reports that she does not use drugs.  Allergies: Not on File  Medications: I have reviewed the patient's current medications.   ROS - all of the below systems have been reviewed with the patient and positives are indicated with bold text General: chills, fever or night sweats Eyes: blurry vision or double vision ENT: epistaxis or sore throat Allergy/Immunology: itchy/watery eyes or nasal congestion Hematologic/Lymphatic: bleeding problems, blood clots or swollen lymph nodes Endocrine: temperature intolerance or unexpected weight changes Breast: new or changing breast lumps or nipple discharge Resp: cough, shortness of breath, or  wheezing CV: chest pain or dyspnea on exertion GI: as per HPI GU: dysuria, trouble voiding, or hematuria MSK: joint pain or joint stiffness Neuro: TIA or stroke symptoms Derm: pruritus and skin lesion changes Psych: anxiety and depression  PE Blood pressure 118/82, pulse 86, temperature 98.8 F (37.1 C), temperature source Oral, resp. rate 18, height 5\' 6"  (1.676 m), weight 82.6 kg, SpO2 99 %. Constitutional: NAD; conversant; no deformities Eyes: Moist conjunctiva; no lid lag; anicteric; PERRL Neck: Trachea midline; no thyromegaly Lungs: Normal respiratory effort; no tactile fremitus CV: RRR; no palpable thrills; no pitting edema GI: Abd soft, nd, tender to palpation throughout but most TTP in RLQ; voluntary guarding; no palpable hepatosplenomegaly MSK: Normal gait; no clubbing/cyanosis Psychiatric: Appropriate affect; alert and oriented x3 Lymphatic: No palpable cervical or axillary lymphadenopathy Skin:no rash/lesions/jaundice  Results for orders placed or performed during the hospital encounter of 07/22/22 (from the past 48 hour(s))  Lipase, blood     Status: Abnormal   Collection Time: 07/21/22 12:00 AM  Result Value Ref Range   Lipase <10 (L) 11 - 51 U/L    Comment: Performed at [facility], [address], [place], [place] [address]  Comprehensive metabolic panel     Status: Abnormal   Collection Time: 07/21/22 12:00 AM  Result Value Ref Range   Sodium 136 135 - 145 mmol/L   Potassium 4.1 3.5 - 5.1 mmol/L   Chloride 100 98 - 111 mmol/L   CO2 25 22 - 32 mmol/L   Glucose, Bld 142 (H) 70 - 99 mg/dL    Comment: Glucose reference range applies only to samples taken after  fasting for at least 8 hours.   BUN 12 6 - 20 mg/dL   Creatinine, Ser 1.10 (H) 0.44 - 1.00 mg/dL   Calcium 9.6 8.9 - 10.3 mg/dL   Total Protein 7.7 6.5 - 8.1 g/dL   Albumin 4.2 3.5 - 5.0 g/dL   AST 12 (L) 15 - 41 U/L   ALT 6 0 - 44 U/L   Alkaline Phosphatase 43 38 - 126 U/L   Total  Bilirubin 1.0 0.3 - 1.2 mg/dL   GFR, Estimated >60 >60 mL/min    Comment: (NOTE) Calculated using the CKD-EPI Creatinine Equation (2021)    Anion gap 11 5 - 15    Comment: Performed at [facility], [place], [place] [address]  CBC     Status: Abnormal   Collection Time: 07/21/22 12:00 AM  Result Value Ref Range   WBC 11.5 (H) 4.0 - 10.5 K/uL   RBC 4.96 3.87 - 5.11 MIL/uL   Hemoglobin 14.5 12.0 - 15.0 g/dL   HCT 43.2 36.0 - 46.0 %   MCV 87.1 80.0 - 100.0 fL   MCH 29.2 26.0 - 34.0 pg   MCHC 33.6 30.0 - 36.0 g/dL   RDW 12.6 11.5 - 15.5 %   Platelets 181 150 - 400 K/uL   nRBC 0.0 0.0 - 0.2 %    Comment: Performed at [facility], [address] [place], [place] [address]    CT ABDOMEN PELVIS W CONTRAST  Result Date: 07/22/2022 CLINICAL DATA:  Abdominal pain, vomiting EXAM: CT ABDOMEN AND PELVIS WITH CONTRAST TECHNIQUE: Multidetector CT imaging of the abdomen and pelvis was performed using the standard protocol following bolus administration of intravenous contrast. RADIATION DOSE REDUCTION: This exam was performed according to the departmental dose-optimization program which includes automated exposure control, adjustment of the mA and/or kV according to patient size and/or use of iterative reconstruction technique. CONTRAST:  72mL OMNIPAQUE IOHEXOL 300 MG/ML  SOLN COMPARISON:  None Available. FINDINGS: Lower chest: No acute abnormality. Hepatobiliary: No focal hepatic abnormality. Gallbladder unremarkable. Pancreas: No focal abnormality or ductal dilatation. Spleen: No focal abnormality.  Normal size. Adrenals/Urinary Tract: No adrenal abnormality. No focal renal abnormality. No stones or hydronephrosis. Urinary bladder is unremarkable. Stomach/Bowel: Appendicolith noted at the base of the appendix. Appendix is mildly dilated, 10 mm. Slight stranding noted adjacent to the appendix. Findings concerning for acute appendicitis. Stomach,  large and small bowel grossly unremarkable. Vascular/Lymphatic: No evidence of aneurysm or adenopathy. Reproductive: 2 cm enhancing fibroid in the fundus. No adnexal masses. Other: Small amount of free fluid in the pelvis. Concern for a few locules of extraluminal gas adjacent to the appendix. Musculoskeletal: No acute bony abnormality. IMPRESSION: Appendicolith at the base of the appendix which is dilated with mild surrounding stranding and concern for possible few small extraluminal locules of gas. Findings concerning for acute appendicitis with possible micro perforation. Small amount of free fluid in the pelvis. These results were called by telephone at the time of interpretation on 07/22/2022 at 2:59 am to provider Veryl Speak , who verbally acknowledged these results. Electronically Signed   By: Rolm Baptise M.D.   On: 07/22/2022 03:01    Imaging: Personally   A/P: Jemiah Marzullo is an 51 y.o. female with  Acute appendicitis with possible perforation Elevated creatinine - appears to be her baseline, Cr 1.06 in 04/2019  We discussed the etiology and management of acute appendicitis. We discussed operative and nonoperative management.  I recommended operative management  along with IV antibiotics since there is no frank perforation with associated abscess/phlegmon  We discussed laparoscopic appendectomy. We discussed the risk and benefits of surgery including but not limited to bleeding, infection, injury to surrounding structures, need to convert to an open procedure, blood clot formation, post operative abscess or wound infection, staple line complications such as leak or bleeding, hernia formation, post operative ileus, need for additional procedures (such as bowel resection), anesthesia complications, and the typical postoperative course. I explained that the patient should expect a good improvement in their symptoms.  High medical decision making-I reviewed all notes from the ED, labs,  personally reviewed her CT scan, labs from 2020, decision regarding emergent surgery  Leighton Ruff. Redmond Pulling, MD, FACS General, Bariatric, & Minimally Invasive Surgery [place]

## 2022-07-22 NOTE — ED Provider Notes (Signed)
  Physical Exam  BP (!) 134/90   Pulse 73   Temp 98 F (36.7 C) (Oral)   Resp 18   Ht 5\' 6"  (1.676 m)   Wt 82.6 kg   SpO2 98%   BMI 29.38 kg/m     Procedures  Procedures  ED Course / MDM    Medical Decision Making Amount and/or Complexity of Data Reviewed Labs: ordered. Radiology: ordered.  Risk Prescription drug management.   24F, presenting with appendicitis with a perforation. Waiting on the OR.  0740 Informed by Alferd Apa, PA of general surgery that Dr. Redmond Pulling is recommending that the patient's go to [facility] to the OR instead of  due to faster availability.  [facility] updated.  Patient pain medicine reordered.  She remains hemodynamically stable for transfer.        Regan Lemming, MD 07/22/22 [id number]

## 2022-07-22 NOTE — ED Notes (Signed)
Handoff report given to [place] at [facility] short stay (pre OP).

## 2022-07-22 NOTE — Plan of Care (Signed)
  Problem: Coping: Goal: Level of anxiety will decrease Outcome: Progressing   Problem: Pain Managment: Goal: General experience of comfort will improve Outcome: Progressing   

## 2022-07-22 NOTE — Anesthesia Procedure Notes (Signed)
Procedure Name: Intubation Date/Time: 07/22/2022 9:53 AM  Performed by: Jenne Campus, CRNAPre-anesthesia Checklist: Patient identified, Emergency Drugs available, Suction available and Patient being monitored Patient Re-evaluated:Patient Re-evaluated prior to induction Oxygen Delivery Method: Circle System Utilized Preoxygenation: Pre-oxygenation with 100% oxygen Induction Type: IV induction, Rapid sequence and Cricoid Pressure applied Laryngoscope Size: Miller and 3 Grade View: Grade II Tube type: Oral Tube size: 7.0 mm Number of attempts: 1 Airway Equipment and Method: Stylet and Oral airway Placement Confirmation: ETT inserted through vocal cords under direct vision, positive ETCO2 and breath sounds checked- equal and bilateral Secured at: 22 cm Tube secured with: Tape Dental Injury: Teeth and Oropharynx as per pre-operative assessment

## 2022-07-22 NOTE — Transfer of Care (Signed)
Immediate Anesthesia Transfer of Care Note  Patient: Janet Perry  Procedure(s) Performed: APPENDECTOMY LAPAROSCOPIC  Patient Location: PACU  Anesthesia Type:General  Level of Consciousness: oriented, drowsy and patient cooperative  Airway & Oxygen Therapy: Patient Spontanous Breathing and Patient connected to face mask oxygen  Post-op Assessment: Report given to RN and Post -op Vital signs reviewed and stable  Post vital signs: Reviewed  Last Vitals:  Vitals Value Taken Time  BP 129/86 07/22/22 1141  Temp    Pulse 88 07/22/22 1143  Resp 17 07/22/22 1143  SpO2 98 % 07/22/22 1143  Vitals shown include unvalidated device data.  Last Pain:  Vitals:   07/22/22 0924  TempSrc: Oral  PainSc:          Complications: No notable events documented.

## 2022-07-22 NOTE — ED Notes (Signed)
[facility] present to transport patient

## 2022-07-22 NOTE — ED Notes (Addendum)
[facility], spoke to [place]. Exchanged information of transferring the patient to [facility]. Jose asked if [facility] was called. Told him that [facility] was called, they did not have any trucks available at this time. Called [facility] as well; it would be a long ETA resulting delaying patient care. EMS will be transporting the patient.

## 2022-07-22 NOTE — ED Notes (Signed)
Patient given warm blanket.

## 2022-07-22 NOTE — Anesthesia Preprocedure Evaluation (Addendum)
Anesthesia Evaluation  Patient identified by MRN, date of birth, ID band Patient awake    Reviewed: Allergy & Precautions, NPO status , Patient's Chart, lab work & pertinent test results  Airway Mallampati: II  TM Distance: >3 FB Neck ROM: Full    Dental no notable dental hx. (+) Teeth Intact, Dental Advisory Given   Pulmonary neg pulmonary ROS,    Pulmonary exam normal breath sounds clear to auscultation       Cardiovascular negative cardio ROS Normal cardiovascular exam Rhythm:Regular Rate:Normal     Neuro/Psych negative neurological ROS  negative psych ROS   GI/Hepatic Neg liver ROS, Acute appendicitis   Endo/Other  Hyperglycemia  Renal/GU Renal InsufficiencyRenal disease  negative genitourinary   Musculoskeletal negative musculoskeletal ROS (+)   Abdominal   Peds  Hematology negative hematology ROS (+)   Anesthesia Other Findings   Reproductive/Obstetrics                            Anesthesia Physical Anesthesia Plan  ASA: 2 and emergent  Anesthesia Plan: General   Post-op Pain Management: Minimal or no pain anticipated, Precedex, Dilaudid IV and Tylenol PO (pre-op)*   Induction: Intravenous and Cricoid pressure planned  PONV Risk Score and Plan: 4 or greater and Treatment may vary due to age or medical condition, Midazolam, Scopolamine patch - Pre-op, Ondansetron and Dexamethasone  Airway Management Planned: Oral ETT  Additional Equipment: None  Intra-op Plan:   Post-operative Plan: Extubation in OR  Informed Consent: I have reviewed the patients History and Physical, chart, labs and discussed the procedure including the risks, benefits and alternatives for the proposed anesthesia with the patient or authorized representative who has indicated his/her understanding and acceptance.     Dental advisory given  Plan Discussed with: CRNA and Anesthesiologist  Anesthesia  Plan Comments:        Anesthesia Quick Evaluation

## 2022-07-22 NOTE — ED Provider Notes (Signed)
[facility]   CSN: [id number] Arrival date & time: 07/21/22  2344     History  Chief Complaint  Patient presents with   Abdominal Pain    Lisette Grinderonya Janea Banfill is a 51 y.o. female.  Patient is a 51 year old female with no significant past medical history.  She presents today with complaints of abdominal pain and vomiting.  She started with nausea and vomiting 2 days ago, then started with abdominal pain and cramping today.  Vomiting has consisted of which she describes as a yellow bile.  She denies any fevers or chills.  She denies constipation.  She denies ill contacts or having consumed any suspicious foods.  The history is provided by the patient.       Home Medications Prior to Admission medications   Medication Sig Start Date End Date Taking? Authorizing Provider  promethazine-dextromethorphan (PROMETHAZINE-DM) 6.25-15 MG/5ML syrup Take 5 mLs by mouth 4 (four) times daily as needed for cough. 04/09/19   McDonald, Mia A, PA-C      Allergies    Patient has no known allergies.    Review of Systems   Review of Systems  All other systems reviewed and are negative.   Physical Exam Updated Vital Signs BP 130/81   Pulse 72   Temp 98 F (36.7 C) (Oral)   Resp 16   Ht 5\' 6"  (1.676 m)   Wt 82.6 kg   SpO2 100%   BMI 29.38 kg/m  Physical Exam Vitals and nursing note reviewed.  Constitutional:      General: She is not in acute distress.    Appearance: She is well-developed. She is not diaphoretic.  HENT:     Head: Normocephalic and atraumatic.  Cardiovascular:     Rate and Rhythm: Normal rate and regular rhythm.     Heart sounds: No murmur heard.    No friction rub. No gallop.  Pulmonary:     Effort: Pulmonary effort is normal. No respiratory distress.     Breath sounds: Normal breath sounds. No wheezing.  Abdominal:     General: Bowel sounds are normal. There is no distension.     Palpations: Abdomen is soft.      Tenderness: There is generalized abdominal tenderness. There is no right CVA tenderness, left CVA tenderness, guarding or rebound.  Musculoskeletal:        General: Normal range of motion.     Cervical back: Normal range of motion and neck supple.  Skin:    General: Skin is warm and dry.  Neurological:     General: No focal deficit present.     Mental Status: She is alert and oriented to person, place, and time.     ED Results / Procedures / Treatments   Labs (all labs ordered are listed, but only abnormal results are displayed) Labs Reviewed  LIPASE, BLOOD - Abnormal; Notable for the following components:      Result Value   Lipase <10 (*)    All other components within normal limits  COMPREHENSIVE METABOLIC PANEL - Abnormal; Notable for the following components:   Glucose, Bld 142 (*)    Creatinine, Ser 1.10 (*)    AST 12 (*)    All other components within normal limits  CBC - Abnormal; Notable for the following components:   WBC 11.5 (*)    All other components within normal limits    EKG None  Radiology No results found.  Procedures Procedures  Medications Ordered in ED Medications  iohexol (OMNIPAQUE) 300 MG/ML solution 100 mL (has no administration in time range)  ketorolac (TORADOL) 30 MG/ML injection 30 mg (30 mg Intravenous Given 07/22/22 0227)  ondansetron (ZOFRAN) injection 4 mg (4 mg Intravenous Given 07/22/22 0226)  sodium chloride 0.9 % bolus 1,000 mL (1,000 mLs Intravenous New Bag/Given 07/22/22 0226)    ED Course/ Medical Decision Making/ A&P  Patient is a 51 year old female presenting with complaints of abdominal pain and vomiting as described in the HPI.  She arrives here afebrile and with stable vital signs.  On exam, there is generalized abdominal tenderness, but most notable in the lower abdomen.  Work-up initiated including laboratory studies, urinalysis, and CT scan of the abdomen and pelvis.  She has a slight leukocytosis of 11,000, but  electrolytes otherwise unremarkable.  CT scan shows a thickened appendix with appendicolith and inflammatory change surrounding it.  There is also possible extraluminal gas consistent with a microperforation.  Findings discussed with Dr. Rosendo Gros from general surgery.  Patient receive IV antibiotics and will be transferred to [facility] for surgical intervention.  Due to multiple held admissions in the ER at [facility], immediate patient transfer is not a possibility.  She will be transferred to preop when the OR time is available.  Final Clinical Impression(s) / ED Diagnoses Final diagnoses:  None    Rx / DC Orders ED Discharge Orders     None         Veryl Speak, MD 07/22/22 [phone]

## 2022-07-22 NOTE — Anesthesia Postprocedure Evaluation (Signed)
Anesthesia Post Note  Patient: Janet Perry  Procedure(s) Performed: APPENDECTOMY LAPAROSCOPIC     Patient location during evaluation: PACU Anesthesia Type: General Level of consciousness: awake and alert and oriented Pain management: pain level controlled Vital Signs Assessment: post-procedure vital signs reviewed and stable Respiratory status: spontaneous breathing, nonlabored ventilation and respiratory function stable Cardiovascular status: blood pressure returned to baseline and stable Postop Assessment: no apparent nausea or vomiting Anesthetic complications: no   No notable events documented.  Last Vitals:  Vitals:   07/22/22 1230 07/22/22 1245  BP: (!) 126/92 (!) 121/90  Pulse: 68 72  Resp: 16 15  Temp:  36.8 C  SpO2: 96% 96%    Last Pain:  Vitals:   07/22/22 1301  TempSrc:   PainSc: 5                  Husain Costabile A.

## 2022-07-23 ENCOUNTER — Encounter (HOSPITAL_COMMUNITY): Payer: Self-pay | Admitting: General Surgery

## 2022-07-23 DIAGNOSIS — K3532 Acute appendicitis with perforation and localized peritonitis, without abscess: Secondary | ICD-10-CM | POA: Diagnosis present

## 2022-07-23 DIAGNOSIS — K660 Peritoneal adhesions (postprocedural) (postinfection): Secondary | ICD-10-CM | POA: Diagnosis present

## 2022-07-23 DIAGNOSIS — K567 Ileus, unspecified: Secondary | ICD-10-CM | POA: Diagnosis not present

## 2022-07-23 DIAGNOSIS — R109 Unspecified abdominal pain: Secondary | ICD-10-CM | POA: Diagnosis present

## 2022-07-23 DIAGNOSIS — N179 Acute kidney failure, unspecified: Secondary | ICD-10-CM | POA: Diagnosis present

## 2022-07-23 LAB — CBC
HCT: 36.9 % (ref 36.0–46.0)
Hemoglobin: 11.6 g/dL — ABNORMAL LOW (ref 12.0–15.0)
MCH: 28.8 pg (ref 26.0–34.0)
MCHC: 31.4 g/dL (ref 30.0–36.0)
MCV: 91.6 fL (ref 80.0–100.0)
Platelets: 160 10*3/uL (ref 150–400)
RBC: 4.03 MIL/uL (ref 3.87–5.11)
RDW: 13.2 % (ref 11.5–15.5)
WBC: 12.2 10*3/uL — ABNORMAL HIGH (ref 4.0–10.5)
nRBC: 0 % (ref 0.0–0.2)

## 2022-07-23 LAB — BASIC METABOLIC PANEL
Anion gap: 5 (ref 5–15)
BUN: 22 mg/dL — ABNORMAL HIGH (ref 6–20)
CO2: 25 mmol/L (ref 22–32)
Calcium: 8.3 mg/dL — ABNORMAL LOW (ref 8.9–10.3)
Chloride: 106 mmol/L (ref 98–111)
Creatinine, Ser: 1.59 mg/dL — ABNORMAL HIGH (ref 0.44–1.00)
GFR, Estimated: 39 mL/min — ABNORMAL LOW (ref >60)
Glucose, Bld: 113 mg/dL — ABNORMAL HIGH (ref 70–99)
Potassium: 4.5 mmol/L (ref 3.5–5.1)
Sodium: 136 mmol/L (ref 135–145)

## 2022-07-23 LAB — SURGICAL PATHOLOGY

## 2022-07-23 NOTE — Progress Notes (Signed)
1 Day Post-Op  Subjective: CC: Doing well. Pain near epigastrium and incisions. Well controlled with po medications. Doing well with cld, taking in a good bit. Some burping/belching but no n/v. No flatus or bm yet. Voiding without difficulty. Up to bathroom and mobilizing in halls.  No prior colonoscopy.   Objective: Vital signs in last 24 hours: Temp:  [97.7 F (36.5 C)-98.4 F (36.9 C)] 98.2 F (36.8 C) (10/18 0506) Pulse Rate:  [52-92] 52 (10/18 0506) Resp:  [15-22] 18 (10/18 0506) BP: (98-132)/(73-92) 113/80 (10/18 0506) SpO2:  [96 %-100 %] 100 % (10/18 0506) Last BM Date : 07/20/22  Intake/Output from previous day: 10/17 0701 - 10/18 0700 In: 3014.8 [P.O.:600; I.V.:2198.6; IV Piggyback:216.1] Out: 550 [Urine:550] Intake/Output this shift: No intake/output data recorded.  PE: Gen:  Alert, NAD, pleasant Card:  Reg Pulm:  CTAB, no W/R/R, effort normal Abd: Soft, mild distension, mostly ttp around her incisions and appears appropriate. No rigidity or guarding. +BS, incisions with gauze and tegarderm in place - cdi Ext:  No LE edema  Psych: A&Ox3   Lab Results:  Recent Labs    07/21/22 0000 07/23/22 0446  WBC 11.5* 12.2*  HGB 14.5 11.6*  HCT 43.2 36.9  PLT 181 160   BMET Recent Labs    07/21/22 0000 07/23/22 0446  NA 136 136  K 4.1 4.5  CL 100 106  CO2 25 25  GLUCOSE 142* 113*  BUN 12 22*  CREATININE 1.10* 1.59*  CALCIUM 9.6 8.3*   PT/INR No results for input(s): "LABPROT", "INR" in the last 72 hours. CMP     Component Value Date/Time   NA 136 07/23/2022 0446   K 4.5 07/23/2022 0446   CL 106 07/23/2022 0446   CO2 25 07/23/2022 0446   GLUCOSE 113 (H) 07/23/2022 0446   BUN 22 (H) 07/23/2022 0446   CREATININE 1.59 (H) 07/23/2022 0446   CALCIUM 8.3 (L) 07/23/2022 0446   PROT 7.7 07/21/2022 0000   ALBUMIN 4.2 07/21/2022 0000   AST 12 (L) 07/21/2022 0000   ALT 6 07/21/2022 0000   ALKPHOS 43 07/21/2022 0000   BILITOT 1.0 07/21/2022 0000    GFRNONAA 39 (L) 07/23/2022 0446   GFRAA >60 04/09/2019 1917   Lipase     Component Value Date/Time   LIPASE <10 (L) 07/21/2022 0000    Studies/Results: CT ABDOMEN PELVIS W CONTRAST  Result Date: 07/22/2022 CLINICAL DATA:  Abdominal pain, vomiting EXAM: CT ABDOMEN AND PELVIS WITH CONTRAST TECHNIQUE: Multidetector CT imaging of the abdomen and pelvis was performed using the standard protocol following bolus administration of intravenous contrast. RADIATION DOSE REDUCTION: This exam was performed according to the departmental dose-optimization program which includes automated exposure control, adjustment of the mA and/or kV according to patient size and/or use of iterative reconstruction technique. CONTRAST:  34mL OMNIPAQUE IOHEXOL 300 MG/ML  SOLN COMPARISON:  None Available. FINDINGS: Lower chest: No acute abnormality. Hepatobiliary: No focal hepatic abnormality. Gallbladder unremarkable. Pancreas: No focal abnormality or ductal dilatation. Spleen: No focal abnormality.  Normal size. Adrenals/Urinary Tract: No adrenal abnormality. No focal renal abnormality. No stones or hydronephrosis. Urinary bladder is unremarkable. Stomach/Bowel: Appendicolith noted at the base of the appendix. Appendix is mildly dilated, 10 mm. Slight stranding noted adjacent to the appendix. Findings concerning for acute appendicitis. Stomach, large and small bowel grossly unremarkable. Vascular/Lymphatic: No evidence of aneurysm or adenopathy. Reproductive: 2 cm enhancing fibroid in the fundus. No adnexal masses. Other: Small amount of free fluid in the  pelvis. Concern for a few locules of extraluminal gas adjacent to the appendix. Musculoskeletal: No acute bony abnormality. IMPRESSION: Appendicolith at the base of the appendix which is dilated with mild surrounding stranding and concern for possible few small extraluminal locules of gas. Findings concerning for acute appendicitis with possible micro perforation. Small amount of  free fluid in the pelvis. These results were called by telephone at the time of interpretation on 07/22/2022 at 2:59 am to provider Veryl Speak , who verbally acknowledged these results. Electronically Signed   By: Rolm Baptise M.D.   On: 07/22/2022 03:01    Anti-infectives: Anti-infectives (From admission, onward)    Start     Dose/Rate Route Frequency Ordered Stop   07/22/22 1400  piperacillin-tazobactam (ZOSYN) IVPB 3.375 g        3.375 g 12.5 mL/hr over 240 Minutes Intravenous Every 8 hours 07/22/22 1318     07/22/22 0315  piperacillin-tazobactam (ZOSYN) IVPB 3.375 g        3.375 g 12.5 mL/hr over 240 Minutes Intravenous  Once 07/22/22 0313 07/22/22 [id number]        Assessment/Plan POD 1 s/p laparoscopic appendectomy for acute perforated appendicitis by Dr. Redmond Pulling on 07/22/2022 - Adv to [place]. AROBF. At risk for ileus - Cont abx, plan 5d. Monitor fever and wbc curve - Mobilize, pulm toilet - Path pending - No prior history of colonoscopy.  Would recommend colonoscopy and ~6 weeks  FEN - FLD, IVF at 176ml/hr VTE - SCDs, Lovenox ID - Zosyn 10/17 >> Afebrile.  No tachycardia or hypotension.  WBC 12.2 from 11.5 Foley - None, voiding  AKI - Cr 1.1 > 1.59. BUN 22. Voiding without difficulty. Cont IVF. D/c IV Robaxin. No obvious other nephrotoxic medications. Repeat labs in am.     LOS: 0 days    Jillyn Ledger , [facility] 07/23/2022, 8:11 AM Please see Amion for pager number during day hours 7:00am-4:30pm

## 2022-07-23 NOTE — Plan of Care (Signed)
  Problem: Coping: Goal: Level of anxiety will decrease Outcome: Progressing   Problem: Pain Managment: Goal: General experience of comfort will improve Outcome: Progressing   Problem: Safety: Goal: Ability to remain free from injury will improve Outcome: Progressing   

## 2022-07-23 NOTE — Discharge Instructions (Addendum)
07/23/22 - Patient provided list of [facility] Primary Care Providers. She will make her own appointments.   [facility]  Please arrive at least 30 min before your appointment to complete your check in paperwork.  If you are unable to arrive 30 min prior to your appointment time we may have to cancel or reschedule you. LAPAROSCOPIC SURGERY: POST OP INSTRUCTIONS Always review your discharge instruction sheet given to you by the facility where your surgery was performed. IF YOU HAVE DISABILITY OR FAMILY LEAVE FORMS, YOU MUST BRING THEM TO THE OFFICE FOR PROCESSING.   DO NOT GIVE THEM TO YOUR DOCTOR.  PAIN CONTROL  First take acetaminophen (Tylenol) AND/or ibuprofen (Advil) to control your pain after surgery.  Follow directions on package.  Taking acetaminophen (Tylenol) and/or ibuprofen (Advil) regularly after surgery will help to control your pain and lower the amount of prescription pain medication you may need.  You should not take more than 4,000 mg (4 grams) of acetaminophen (Tylenol) in 24 hours.  You should not take ibuprofen (Advil), aleve, motrin, naprosyn or other NSAIDS if you have a history of stomach ulcers or chronic kidney disease.  A prescription for pain medication may be given to you upon discharge.  Take your pain medication as prescribed, if you still have uncontrolled pain after taking acetaminophen (Tylenol) or ibuprofen (Advil). Use ice packs to help control pain. If you need a refill on your pain medication, please contact your pharmacy.  They will contact our office to request authorization. Prescriptions will not be filled after 5pm or on week-ends.  HOME MEDICATIONS Take your usually prescribed medications unless otherwise directed.  DIET You should follow a light diet the first few days after arrival home.  Be sure to include lots of fluids daily. Avoid fatty, fried foods.   CONSTIPATION It is common to experience some constipation after surgery and  if you are taking pain medication.  Increasing fluid intake and taking a stool softener (such as Colace) will usually help or prevent this problem from occurring.  A mild laxative (Milk of Magnesia or Miralax) should be taken according to package instructions if there are no bowel movements after 48 hours.  WOUND/INCISION CARE Most patients will experience some swelling and bruising in the area of the incisions.  Ice packs will help.  Swelling and bruising can take several days to resolve.  Unless discharge instructions indicate otherwise, follow guidelines below  STERI-STRIPS - you may remove your outer bandages 48 hours after surgery, and you may shower at that time.  You have steri-strips (small skin tapes) in place directly over the incision.  These strips should be left on the skin for 7-10 days.   DERMABOND/SKIN GLUE - you may shower in 24 hours.  The glue will flake off over the next 2-3 weeks. Any sutures or staples will be removed at the office during your follow-up visit.  ACTIVITIES You may resume regular (light) daily activities beginning the next day--such as daily self-care, walking, climbing stairs--gradually increasing activities as tolerated.  You may have sexual intercourse when it is comfortable.  Refrain from any heavy lifting or straining until approved by your doctor. You may drive when you are no longer taking prescription pain medication, you can comfortably wear a seatbelt, and you can safely maneuver your car and apply brakes.  FOLLOW-UP You should see your doctor in the office for a follow-up appointment approximately 2-3 weeks after your surgery.  You should have been given your  post-op/follow-up appointment when your surgery was scheduled.  If you did not receive a post-op/follow-up appointment, make sure that you call for this appointment within a day or two after you arrive home to insure a convenient appointment time.   WHEN TO CALL YOUR DOCTOR: Fever over  101.0 Inability to urinate Continued bleeding from incision. Increased pain, redness, or drainage from the incision. Increasing abdominal pain  The clinic staff is available to answer your questions during regular business hours.  Please don't hesitate to call and ask to speak to one of the nurses for clinical concerns.  If you have a medical emergency, go to the nearest emergency room or call 911.  A surgeon from [facility] is always on call at the hospital. [address] [place], [place], [place]  [address] ? [address] [place], [place], [place]   [address] [phone] ? [phone] ? FAX [phone]

## 2022-07-23 NOTE — TOC Initial Note (Addendum)
Transition of Care [facility]) - Initial/Assessment Note    Patient Details  Name: Janet Perry MRN: [id number] Date of Birth: 1971/08/27  Transition of [facility]) CM/SW Contact:    Henrietta Dine, RN Phone Number: 07/23/2022, 3:55 PM  Clinical Narrative:                 Spoke with pt in room; the pt verifies that she has insurance with [name] but she did not have her card upon admission; she does not have a PCP; the pt says she lives at home with sister Janet Perry [phone]); additional POC nephew Janet Perry [phone]); her plan is to return home; she also has transportation home and to her appts; she has no food insecurities and is able to get her meds; provided pt with list of [facility] Primary Care Providers; she will make her own appts; no TOC needs.  Expected Discharge Plan: Home/Self Care Barriers to Discharge: Continued Medical Work up   Patient Goals and CMS Choice        Expected Discharge Plan and Services Expected Discharge Plan: Home/Self Care       Living arrangements for the past 2 months: Apartment                                      Prior Living Arrangements/Services Living arrangements for the past 2 months: Apartment Lives with:: Siblings Patient language and need for interpreter reviewed:: Yes Do you feel safe going back to the place where you live?: Yes      Need for Family Participation in Patient Care: Yes (Comment) Care giver support system in place?: Yes (comment)   Criminal Activity/Legal Involvement Pertinent to Current Situation/Hospitalization: No - Comment as needed  Activities of Daily Living Home Assistive Devices/Equipment: None ADL Screening (condition at time of admission) Patient's cognitive ability adequate to safely complete daily activities?: Yes Is the patient deaf or have difficulty hearing?: No Does the patient have difficulty seeing, even when wearing glasses/contacts?: No Does the patient have difficulty  concentrating, remembering, or making decisions?: No Patient able to express need for assistance with ADLs?: Yes Does the patient have difficulty dressing or bathing?: No Independently performs ADLs?: Yes (appropriate for developmental age) Does the patient have difficulty walking or climbing stairs?: No Weakness of Legs: None Weakness of Arms/Hands: None  Permission Sought/Granted Permission sought to share information with : Case Manager Permission granted to share information with : Yes, Verbal Permission Granted  Share Information with NAME: Lenor Coffin, RN, CM           Emotional Assessment Appearance:: Appears stated age Attitude/Demeanor/Rapport: Gracious Affect (typically observed): Accepting Orientation: : Oriented to Self, Oriented to Place, Oriented to  Time, Oriented to Situation Alcohol / Substance Use: Not Applicable Psych Involvement: No (comment)  Admission diagnosis:  Acute appendicitis with perforation and localized peritonitis, without abscess or gangrene [K35.32] Perforated appendicitis [K35.32] Patient Active Problem List   Diagnosis Date Noted   Perforated appendicitis 07/22/2022   PCP:  Patient, No Pcp Per Pharmacy:   [facility] [place], [place] [address] Phone: [phone] Fax: [phone]     Social Determinants of Health (SDOH) Interventions    Readmission Risk Interventions     No data to display

## 2022-07-24 LAB — BASIC METABOLIC PANEL
Anion gap: 5 (ref 5–15)
BUN: 18 mg/dL (ref 6–20)
CO2: 22 mmol/L (ref 22–32)
Calcium: 7.9 mg/dL — ABNORMAL LOW (ref 8.9–10.3)
Chloride: 109 mmol/L (ref 98–111)
Creatinine, Ser: 1.03 mg/dL — ABNORMAL HIGH (ref 0.44–1.00)
GFR, Estimated: >60 mL/min (ref >60)
Glucose, Bld: 92 mg/dL (ref 70–99)
Potassium: 3.7 mmol/L (ref 3.5–5.1)
Sodium: 136 mmol/L (ref 135–145)

## 2022-07-24 LAB — CBC
HCT: 33.9 % — ABNORMAL LOW (ref 36.0–46.0)
Hemoglobin: 10.8 g/dL — ABNORMAL LOW (ref 12.0–15.0)
MCH: 29.3 pg (ref 26.0–34.0)
MCHC: 31.9 g/dL (ref 30.0–36.0)
MCV: 91.9 fL (ref 80.0–100.0)
Platelets: 159 10*3/uL (ref 150–400)
RBC: 3.69 MIL/uL — ABNORMAL LOW (ref 3.87–5.11)
RDW: 13.3 % (ref 11.5–15.5)
WBC: 9 10*3/uL (ref 4.0–10.5)
nRBC: 0 % (ref 0.0–0.2)

## 2022-07-24 LAB — HIV ANTIBODY (ROUTINE TESTING W REFLEX): HIV Screen 4th Generation wRfx: NONREACTIVE

## 2022-07-24 NOTE — Plan of Care (Signed)

## 2022-07-24 NOTE — Progress Notes (Signed)
2 Days Post-Op  Subjective: CC: Pain over left abdomen yesterday, points to around incisions. Resolved after oxy x 1. No further pain this morning. No prn meds since midnight. Tolerating fld but just taking in grits (doesn't like options and appetitie isn't 100% back). No n/v but still some burping/belching. Passing more flatus. No bm. Voiding. Mobilizing in halls.  Afebrile. WBC normalized. Cr better.  Objective: Vital signs in last 24 hours: Temp:  [97.7 F (36.5 C)-98.6 F (37 C)] 98.3 F (36.8 C) (10/19 0601) Pulse Rate:  [57-64] 57 (10/19 0601) Resp:  [17-18] 18 (10/19 0601) BP: (119-122)/(84-90) 122/90 (10/19 0601) SpO2:  [98 %-100 %] 99 % (10/19 0601) Last BM Date : 07/21/22  Intake/Output from previous day: 10/18 0701 - 10/19 0700 In: 3807.6 [P.O.:1320; I.V.:2353.7; IV Piggyback:133.8] Out: 750 [Urine:750] Intake/Output this shift: No intake/output data recorded.  PE: Gen:  Alert, NAD, pleasant Card:  Reg Pulm:  CTAB, no W/R/R, effort normal Abd: Soft, mild distension, ttp around her incisions (L>umbilical) that appears appropriate. No rigidity or guarding. Otherwise NT. +BS, incisions with gauze and tegarderm in place - cdi Ext:  No LE edema  Psych: A&Ox3   Lab Results:  Recent Labs    07/23/22 0446 07/24/22 0432  WBC 12.2* 9.0  HGB 11.6* 10.8*  HCT 36.9 33.9*  PLT 160 159   BMET Recent Labs    07/23/22 0446 07/24/22 0432  NA 136 136  K 4.5 3.7  CL 106 109  CO2 25 22  GLUCOSE 113* 92  BUN 22* 18  CREATININE 1.59* 1.03*  CALCIUM 8.3* 7.9*   PT/INR No results for input(s): "LABPROT", "INR" in the last 72 hours. CMP     Component Value Date/Time   NA 136 07/24/2022 0432   K 3.7 07/24/2022 0432   CL 109 07/24/2022 0432   CO2 22 07/24/2022 0432   GLUCOSE 92 07/24/2022 0432   BUN 18 07/24/2022 0432   CREATININE 1.03 (H) 07/24/2022 0432   CALCIUM 7.9 (L) 07/24/2022 0432   PROT 7.7 07/21/2022 0000   ALBUMIN 4.2 07/21/2022 0000   AST 12  (L) 07/21/2022 0000   ALT 6 07/21/2022 0000   ALKPHOS 43 07/21/2022 0000   BILITOT 1.0 07/21/2022 0000   GFRNONAA >60 07/24/2022 0432   GFRAA >60 04/09/2019 1917   Lipase     Component Value Date/Time   LIPASE <10 (L) 07/21/2022 0000    Studies/Results: No results found.  Anti-infectives: Anti-infectives (From admission, onward)    Start     Dose/Rate Route Frequency Ordered Stop   07/22/22 1400  piperacillin-tazobactam (ZOSYN) IVPB 3.375 g        3.375 g 12.5 mL/hr over 240 Minutes Intravenous Every 8 hours 07/22/22 1318     07/22/22 0315  piperacillin-tazobactam (ZOSYN) IVPB 3.375 g        3.375 g 12.5 mL/hr over 240 Minutes Intravenous  Once 07/22/22 0313 07/22/22 0648      Path A. APPENDIX, APPENDECTOMY:  Marked acute appendicitis with a focal transmural necrosis consistent  with perforation  Marked acute fibropurulent serositis  Fibrous obliteration of appendiceal tip   Assessment/Plan POD 2 s/p laparoscopic appendectomy for acute perforated appendicitis by Dr. Andrey Campanile on 07/22/2022 - Adv to soft diet - Cont abx, plan 5d. Monitor fever and wbc curve - Mobilize, pulm toilet - Path benign. No prior history of colonoscopy.  Would recommend colonoscopy and ~6 weeks   FEN - Soft, dec IVF to 42ml/hr VTE - SCDs,  Lovenox ID - Zosyn 10/17 >> Afebrile.  No tachycardia or hypotension.  WBC normalized at 9 Foley - None, voiding   AKI - resolved, Cr 1.03   LOS: 1 day    Jillyn Ledger , [facility] Surgery 07/24/2022, 7:55 AM Please see Amion for pager number during day hours 7:00am-4:30pm

## 2022-07-25 MED ORDER — METHOCARBAMOL 500 MG PO TABS: 500.0000 mg | ORAL_TABLET | Freq: Three times a day (TID) | ORAL | 0 refills | Status: AC | PRN

## 2022-07-25 MED ORDER — OXYCODONE HCL 5 MG PO TABS: 5.0000 mg | ORAL_TABLET | Freq: Four times a day (QID) | ORAL | 0 refills | Status: AC | PRN

## 2022-07-25 MED ORDER — AMOXICILLIN-POT CLAVULANATE 875-125 MG PO TABS: 1.0000 | ORAL_TABLET | Freq: Two times a day (BID) | ORAL | 0 refills | Status: AC

## 2022-07-25 MED ORDER — ACETAMINOPHEN 500 MG PO TABS: 1000.0000 mg | ORAL_TABLET | Freq: Three times a day (TID) | ORAL | 0 refills | Status: AC | PRN

## 2022-07-25 NOTE — Plan of Care (Signed)
Patient is stable for discharge. Discharge instructions given to patient, instructions understood. All questions answered, patient is taken down per wheelchair to be discharged home with her sister.

## 2022-07-25 NOTE — Discharge Summary (Addendum)
Patient ID: Janet Perry [id number] 1970-11-12 51 y.o.  Admit date: 07/22/2022 Discharge date: 07/25/2022  Discharge Diagnosis S/p laparoscopic appendectomy for acute perforated appendicitis by Dr. Redmond Pulling on 07/22/2022 AKI - resolved, Cr 1.03  Consultants None  Reason for Admission: Janet Perry is an 51 y.o. female who is here for worsening abdominal pain.  She states that she developed abdominal pain and vomiting on Sunday evening.  It was constant.  Initially was like sharp shooting stabbing pains all over her abdomen but then it became focused more in her right lower quadrant.  She denies any fevers or chills.  She states that she was just mainly vomiting up sort of clear fluid.  Last bowel movement was Monday.  Because her pain was persistent she went to [place] for evaluation.  There she underwent labs and a CT scan which revealed acute appendicitis with possible minor perforation.  General surgery was consulted and we arranged for the patient to be transported to [place] long.   SHe denies any prior abdominal surgery.  She denies any personal or family history of blood clots.  Denies any melena hematochezia.  She denies any hematemesis.  No dysuria.   Denies any past medical history.  Procedures Dr. Redmond Pulling - Laparoscopic Appendectomy - 07/22/22  Hospital Course:  Patient presented as above.  Was taken to the operating room by Dr. Redmond Pulling for laparoscopic appendectomy.  Was found to have acute perforated appendicitis.  Remained on antibiotics postop.  Had mild ileus that resolved, diet was advanced and tolerated.  WBC normalized.  On POD 3, the patient was tolerating a diet without any further burping/belching, nausea or vomiting; pain well controlled with p.o. medications, voiding, mobilizing, incisions CDI, VSS and felt stable for discharge home. Discussed discharge instructions, restrictions and return/call back precautions. Follow up as noted below. Work  note was provided. Discharge on 5d abx.  Physical Exam: Gen:  Alert, NAD, pleasant Card:  Reg Pulm:  CTAB, no W/R/R, effort normal Abd: Soft, distension resolved/ND. Mild ttp around incision but otherwise NT. No rigidity or guarding. +BS, incisions with gauze and tegarderm in place - cdi Ext:  No LE edema  Psych: A&Ox3   Allergies as of 07/25/2022   No Known Allergies      Medication List     TAKE these medications    acetaminophen 500 MG tablet Commonly known as: TYLENOL Take 2 tablets (1,000 mg total) by mouth every 8 (eight) hours as needed.   amoxicillin-clavulanate 875-125 MG tablet Commonly known as: AUGMENTIN Take 1 tablet by mouth every 12 (twelve) hours.   methocarbamol 500 MG tablet Commonly known as: ROBAXIN Take 1 tablet (500 mg total) by mouth every 8 (eight) hours as needed for muscle spasms.   oxyCODONE 5 MG immediate release tablet Commonly known as: Oxy IR/ROXICODONE Take 1 tablet (5 mg total) by mouth every 6 (six) hours as needed for breakthrough pain.        Patient age 26 - referral to GI for routine screening colonoscopy  Follow-up Information     Surgery, [place]. Call in 1 day(s).   Specialty: General Surgery Why: Please call to confirm your appointment date and time. We are working hard to make this for you. Please arrive 30 minutes prior to your appointment for paperwork. Please bring a copy of your photo ID and insurance card. Contact information: [place] [address] [phone]  Gastroenterology, [place] Follow up.   Why: Call to to discuss screening colonoscopy. Contact information: [address] [place] [address] [phone]                 Signed: Alferd Apa, [facility] Surgery 07/25/2022, 9:34 AM Please see Amion for pager number during day hours 7:00am-4:30pm

## 2023-03-24 ENCOUNTER — Other Ambulatory Visit (HOSPITAL_COMMUNITY): Payer: Self-pay

## 2023-04-23 DIAGNOSIS — R140 Abdominal distension (gaseous): Secondary | ICD-10-CM | POA: Diagnosis not present

## 2023-04-23 DIAGNOSIS — Z1211 Encounter for screening for malignant neoplasm of colon: Secondary | ICD-10-CM | POA: Diagnosis not present

## 2023-04-23 DIAGNOSIS — K5904 Chronic idiopathic constipation: Secondary | ICD-10-CM | POA: Diagnosis not present

## 2023-04-23 DIAGNOSIS — R194 Change in bowel habit: Secondary | ICD-10-CM | POA: Diagnosis not present

## 2023-05-30 DIAGNOSIS — Z1231 Encounter for screening mammogram for malignant neoplasm of breast: Secondary | ICD-10-CM | POA: Diagnosis not present

## 2023-07-06 DIAGNOSIS — N390 Urinary tract infection, site not specified: Secondary | ICD-10-CM | POA: Diagnosis not present

## 2023-07-06 DIAGNOSIS — Z124 Encounter for screening for malignant neoplasm of cervix: Secondary | ICD-10-CM | POA: Diagnosis not present

## 2023-07-24 DIAGNOSIS — Z1211 Encounter for screening for malignant neoplasm of colon: Secondary | ICD-10-CM | POA: Diagnosis not present

## 2024-07-14 DIAGNOSIS — Z1231 Encounter for screening mammogram for malignant neoplasm of breast: Secondary | ICD-10-CM | POA: Diagnosis not present

## 2024-08-18 DIAGNOSIS — Z01419 Encounter for gynecological examination (general) (routine) without abnormal findings: Secondary | ICD-10-CM | POA: Diagnosis not present

## 2024-08-31 DIAGNOSIS — N390 Urinary tract infection, site not specified: Secondary | ICD-10-CM | POA: Diagnosis not present
